# Patient Record
Sex: Male | Born: 1995 | Race: White | Hispanic: No | Marital: Single | Smoking: Current every day smoker
Health system: Southern US, Community
[De-identification: ages and names within clinical notes are randomized; demographics above are authoritative.]

## PROBLEM LIST (undated history)

## (undated) DIAGNOSIS — J189 Pneumonia, unspecified organism: Secondary | ICD-10-CM

## (undated) DIAGNOSIS — R011 Cardiac murmur, unspecified: Secondary | ICD-10-CM

## (undated) DIAGNOSIS — F419 Anxiety disorder, unspecified: Secondary | ICD-10-CM

---
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
## 2013-03-21 ENCOUNTER — Ambulatory Visit (INDEPENDENT_AMBULATORY_CARE_PROVIDER_SITE_OTHER): Payer: BC Managed Care – PPO | Admitting: Ophthalmology

## 2013-06-28 ENCOUNTER — Ambulatory Visit (INDEPENDENT_AMBULATORY_CARE_PROVIDER_SITE_OTHER): Payer: Self-pay | Admitting: Ophthalmology

## 2013-07-03 ENCOUNTER — Ambulatory Visit (INDEPENDENT_AMBULATORY_CARE_PROVIDER_SITE_OTHER): Payer: BC Managed Care – PPO | Admitting: Ophthalmology

## 2013-07-03 DIAGNOSIS — H33309 Unspecified retinal break, unspecified eye: Secondary | ICD-10-CM

## 2013-07-03 DIAGNOSIS — H33009 Unspecified retinal detachment with retinal break, unspecified eye: Secondary | ICD-10-CM

## 2013-07-03 DIAGNOSIS — H43819 Vitreous degeneration, unspecified eye: Secondary | ICD-10-CM

## 2013-07-03 DIAGNOSIS — H31009 Unspecified chorioretinal scars, unspecified eye: Secondary | ICD-10-CM

## 2013-07-03 NOTE — H&P (Signed)
Micheal Booker is an 18 y.o. male.   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  No past medical history on file.  No past surgical history on file.  No family history on file. Social History:  has no tobacco, alcohol, and drug history on file.  Allergies: Allergies not on file  No prescriptions prior to admission    Review of systems otherwise negative  There were no vitals taken for this visit.  Physical exam: Mental status: oriented x3. Eyes: See eye exam associated with this date of surgery in media tab.  Scanned in by scanning center Ears, Nose, Throat: within normal limits Neck: Within Normal limits General: within normal limits Chest: Within normal limits Breast: deferred Heart: Within normal limits Abdomen: Within normal limits GU: deferred Extremities: within normal limits Skin: within normal limits  Assessment/Plan Rhegmatogenous retinal detachment right eye Plan: To [facility] for Scleral buckle right eye  Hayden Pedro 07/03/2013, 3:28 PM

## 2013-07-06 ENCOUNTER — Encounter (HOSPITAL_COMMUNITY): Payer: Self-pay | Admitting: Pharmacy Technician

## 2013-07-09 ENCOUNTER — Encounter (HOSPITAL_COMMUNITY): Payer: Self-pay | Admitting: *Deleted

## 2013-07-10 ENCOUNTER — Encounter (HOSPITAL_COMMUNITY): Payer: BC Managed Care – PPO | Admitting: Anesthesiology

## 2013-07-10 ENCOUNTER — Ambulatory Visit (HOSPITAL_COMMUNITY): Payer: BC Managed Care – PPO | Admitting: Anesthesiology

## 2013-07-10 ENCOUNTER — Encounter (HOSPITAL_COMMUNITY): Payer: Self-pay | Admitting: *Deleted

## 2013-07-10 ENCOUNTER — Ambulatory Visit (HOSPITAL_COMMUNITY): Payer: BC Managed Care – PPO

## 2013-07-10 ENCOUNTER — Encounter (HOSPITAL_COMMUNITY)
Admission: RE | Disposition: A | Discharge status: Home / Self Care | Payer: Self-pay | Source: Ambulatory Visit | Attending: Ophthalmology

## 2013-07-10 ENCOUNTER — Observation Stay (HOSPITAL_COMMUNITY)
Admission: RE | Admit: 2013-07-10 | Discharge: 2013-07-11 | Disposition: A | Discharge status: Home / Self Care | Payer: BC Managed Care – PPO | Source: Ambulatory Visit | Attending: Ophthalmology | Admitting: Ophthalmology

## 2013-07-10 DIAGNOSIS — F411 Generalized anxiety disorder: Secondary | ICD-10-CM | POA: Diagnosis not present

## 2013-07-10 DIAGNOSIS — F17200 Nicotine dependence, unspecified, uncomplicated: Secondary | ICD-10-CM | POA: Insufficient documentation

## 2013-07-10 DIAGNOSIS — H33009 Unspecified retinal detachment with retinal break, unspecified eye: Principal | ICD-10-CM | POA: Insufficient documentation

## 2013-07-10 DIAGNOSIS — H33001 Unspecified retinal detachment with retinal break, right eye: Secondary | ICD-10-CM | POA: Diagnosis present

## 2013-07-10 HISTORY — DX: Cardiac murmur, unspecified: R01.1

## 2013-07-10 HISTORY — PX: SCLERAL BUCKLE: SHX5340

## 2013-07-10 HISTORY — DX: Anxiety disorder, unspecified: F41.9

## 2013-07-10 HISTORY — DX: Pneumonia, unspecified organism: J18.9

## 2013-07-10 HISTORY — PX: LASER PHOTO ABLATION: SHX5942

## 2013-07-10 LAB — CBC
HCT: 41.7 % (ref 39.0–52.0)
Hemoglobin: 14.7 g/dL (ref 13.0–17.0)
MCH: 30.9 pg (ref 26.0–34.0)
MCHC: 35.3 g/dL (ref 30.0–36.0)
MCV: 87.6 fL (ref 78.0–100.0)
Platelets: 283 K/uL (ref 150–400)
RBC: 4.76 MIL/uL (ref 4.22–5.81)
RDW: 12.4 % (ref 11.5–15.5)
WBC: 8 K/uL (ref 4.0–10.5)

## 2013-07-10 SURGERY — SCLERAL BUCKLE
Anesthesia: General | Site: Eye | Laterality: Right

## 2013-07-10 MED ORDER — SERTRALINE HCL 25 MG PO TABS
25.0000 mg | ORAL_TABLET | Freq: Every day | ORAL | Status: DC
  Filled 2013-07-10: qty 1

## 2013-07-10 MED ORDER — BUPIVACAINE HCL (PF) 0.75 % IJ SOLN
INTRAMUSCULAR | Status: AC
  Filled 2013-07-10: qty 10

## 2013-07-10 MED ORDER — FENTANYL CITRATE 0.05 MG/ML IJ SOLN
INTRAMUSCULAR | Status: AC
  Filled 2013-07-10: qty 5

## 2013-07-10 MED ORDER — BRIMONIDINE TARTRATE 0.2 % OP SOLN
1.0000 [drp] | Freq: Two times a day (BID) | OPHTHALMIC | Status: DC
  Filled 2013-07-10: qty 5

## 2013-07-10 MED ORDER — BUPIVACAINE HCL (PF) 0.75 % IJ SOLN
INTRAMUSCULAR | Status: DC | PRN
  Administered 2013-07-10: 10 mL

## 2013-07-10 MED ORDER — BSS IO SOLN
INTRAOCULAR | Status: DC | PRN
  Administered 2013-07-10: 15 mL via INTRAOCULAR

## 2013-07-10 MED ORDER — FENTANYL CITRATE 0.05 MG/ML IJ SOLN
INTRAMUSCULAR | Status: DC | PRN
  Administered 2013-07-10: 25 ug via INTRAVENOUS
  Administered 2013-07-10 (×2): 50 ug via INTRAVENOUS
  Administered 2013-07-10: 25 ug via INTRAVENOUS

## 2013-07-10 MED ORDER — DEXAMETHASONE SODIUM PHOSPHATE 10 MG/ML IJ SOLN
INTRAMUSCULAR | Status: AC
  Filled 2013-07-10: qty 1

## 2013-07-10 MED ORDER — CEFAZOLIN SODIUM-DEXTROSE 2-3 GM-% IV SOLR
INTRAVENOUS | Status: DC | PRN
  Administered 2013-07-10: 2 g via INTRAVENOUS

## 2013-07-10 MED ORDER — HYDROMORPHONE HCL PF 1 MG/ML IJ SOLN
INTRAMUSCULAR | Status: AC
  Filled 2013-07-10: qty 1

## 2013-07-10 MED ORDER — LACTATED RINGERS IV SOLN
INTRAVENOUS | Status: DC | PRN
  Administered 2013-07-10 (×2): via INTRAVENOUS

## 2013-07-10 MED ORDER — LATANOPROST 0.005 % OP SOLN
1.0000 [drp] | Freq: Every day | OPHTHALMIC | Status: DC
  Filled 2013-07-10: qty 2.5

## 2013-07-10 MED ORDER — OXYCODONE HCL 5 MG PO TABS: 5.0000 mg | ORAL_TABLET | Freq: Once | ORAL | Status: DC | PRN

## 2013-07-10 MED ORDER — LIDOCAINE HCL 2 % IJ SOLN
INTRAMUSCULAR | Status: AC
  Filled 2013-07-10: qty 20

## 2013-07-10 MED ORDER — HYDROCODONE-ACETAMINOPHEN 5-325 MG PO TABS
1.0000 | ORAL_TABLET | ORAL | Status: DC | PRN
  Administered 2013-07-10: 2 via ORAL
  Filled 2013-07-10: qty 2

## 2013-07-10 MED ORDER — CYCLOPENTOLATE HCL 1 % OP SOLN
1.0000 [drp] | OPHTHALMIC | Status: AC | PRN
  Administered 2013-07-10: 1 [drp] via OPHTHALMIC
  Filled 2013-07-10: qty 2

## 2013-07-10 MED ORDER — DOCUSATE SODIUM 100 MG PO CAPS
100.0000 mg | ORAL_CAPSULE | Freq: Two times a day (BID) | ORAL | Status: DC
  Administered 2013-07-10: 100 mg via ORAL
  Filled 2013-07-10: qty 1

## 2013-07-10 MED ORDER — DEXAMETHASONE SODIUM PHOSPHATE 10 MG/ML IJ SOLN
INTRAMUSCULAR | Status: DC | PRN
  Administered 2013-07-10: 10 mg

## 2013-07-10 MED ORDER — MINERAL OIL LIGHT 100 % EX OIL
TOPICAL_OIL | CUTANEOUS | Status: AC
  Filled 2013-07-10: qty 25

## 2013-07-10 MED ORDER — POLYMYXIN B SULFATE 500000 UNITS IJ SOLR
INTRAMUSCULAR | Status: AC
  Filled 2013-07-10: qty 1

## 2013-07-10 MED ORDER — ROCURONIUM BROMIDE 50 MG/5ML IV SOLN
INTRAVENOUS | Status: AC
  Filled 2013-07-10: qty 1

## 2013-07-10 MED ORDER — BACITRACIN-POLYMYXIN B 500-10000 UNIT/GM OP OINT
TOPICAL_OINTMENT | OPHTHALMIC | Status: DC | PRN
  Administered 2013-07-10: 1 via OPHTHALMIC

## 2013-07-10 MED ORDER — TETRACAINE HCL 0.5 % OP SOLN
2.0000 [drp] | Freq: Once | OPHTHALMIC | Status: DC
  Filled 2013-07-10: qty 2

## 2013-07-10 MED ORDER — GATIFLOXACIN 0.5 % OP SOLN
1.0000 [drp] | Freq: Four times a day (QID) | OPHTHALMIC | Status: DC
  Filled 2013-07-10: qty 2.5

## 2013-07-10 MED ORDER — MAGNESIUM HYDROXIDE 400 MG/5ML PO SUSP: 15.0000 mL | Freq: Four times a day (QID) | ORAL | Status: DC | PRN

## 2013-07-10 MED ORDER — TEMAZEPAM 15 MG PO CAPS: 15.0000 mg | ORAL_CAPSULE | Freq: Every evening | ORAL | Status: DC | PRN

## 2013-07-10 MED ORDER — PROPOFOL 10 MG/ML IV BOLUS
INTRAVENOUS | Status: DC | PRN
  Administered 2013-07-10: 150 mg via INTRAVENOUS

## 2013-07-10 MED ORDER — GATIFLOXACIN 0.5 % OP SOLN
1.0000 [drp] | OPHTHALMIC | Status: DC | PRN
  Administered 2013-07-10 (×2): 1 [drp] via OPHTHALMIC

## 2013-07-10 MED ORDER — ONDANSETRON HCL 4 MG/2ML IJ SOLN: 4.0000 mg | Freq: Four times a day (QID) | INTRAMUSCULAR | Status: DC | PRN

## 2013-07-10 MED ORDER — BSS IO SOLN
INTRAOCULAR | Status: AC
  Filled 2013-07-10: qty 15

## 2013-07-10 MED ORDER — PHENYLEPHRINE HCL 2.5 % OP SOLN
1.0000 [drp] | OPHTHALMIC | Status: AC | PRN
  Administered 2013-07-10: 1 [drp] via OPHTHALMIC
  Filled 2013-07-10: qty 15

## 2013-07-10 MED ORDER — TROPICAMIDE 1 % OP SOLN
1.0000 [drp] | OPHTHALMIC | Status: AC | PRN
  Administered 2013-07-10: 1 [drp] via OPHTHALMIC
  Filled 2013-07-10: qty 3

## 2013-07-10 MED ORDER — ACETAZOLAMIDE SODIUM 500 MG IJ SOLR
INTRAMUSCULAR | Status: AC
  Filled 2013-07-10: qty 500

## 2013-07-10 MED ORDER — PREDNISOLONE ACETATE 1 % OP SUSP
1.0000 [drp] | Freq: Four times a day (QID) | OPHTHALMIC | Status: DC
  Filled 2013-07-10: qty 1

## 2013-07-10 MED ORDER — BACITRACIN-POLYMYXIN B 500-10000 UNIT/GM OP OINT
TOPICAL_OINTMENT | OPHTHALMIC | Status: AC
  Filled 2013-07-10: qty 3.5

## 2013-07-10 MED ORDER — OXYCODONE HCL 5 MG/5ML PO SOLN: 5.0000 mg | Freq: Once | ORAL | Status: DC | PRN

## 2013-07-10 MED ORDER — GLYCOPYRROLATE 0.2 MG/ML IJ SOLN
INTRAMUSCULAR | Status: AC
Start: 2013-07-10 — End: 2013-07-10
  Filled 2013-07-10: qty 2

## 2013-07-10 MED ORDER — 0.9 % SODIUM CHLORIDE (POUR BTL) OPTIME
TOPICAL | Status: DC | PRN
  Administered 2013-07-10: 1000 mL

## 2013-07-10 MED ORDER — GLYCOPYRROLATE 0.2 MG/ML IJ SOLN
INTRAMUSCULAR | Status: DC | PRN
  Administered 2013-07-10: 0.2 mg via INTRAVENOUS
  Administered 2013-07-10: 0.4 mg via INTRAVENOUS

## 2013-07-10 MED ORDER — SODIUM CHLORIDE 0.9 % IV SOLN
INTRAVENOUS | Status: DC
  Administered 2013-07-10: 11:00:00 via INTRAVENOUS

## 2013-07-10 MED ORDER — HYDROMORPHONE HCL PF 1 MG/ML IJ SOLN
0.2500 mg | INTRAMUSCULAR | Status: DC | PRN
  Administered 2013-07-10: 0.5 mg via INTRAVENOUS

## 2013-07-10 MED ORDER — HYPROMELLOSE (GONIOSCOPIC) 2.5 % OP SOLN
OPHTHALMIC | Status: AC
  Filled 2013-07-10: qty 15

## 2013-07-10 MED ORDER — MIDAZOLAM HCL 5 MG/5ML IJ SOLN
INTRAMUSCULAR | Status: DC | PRN
  Administered 2013-07-10 (×2): 1 mg via INTRAVENOUS

## 2013-07-10 MED ORDER — BSS PLUS IO SOLN
INTRAOCULAR | Status: AC
  Filled 2013-07-10: qty 500

## 2013-07-10 MED ORDER — ROCURONIUM BROMIDE 100 MG/10ML IV SOLN
INTRAVENOUS | Status: DC | PRN
  Administered 2013-07-10: 50 mg via INTRAVENOUS

## 2013-07-10 MED ORDER — NEOSTIGMINE METHYLSULFATE 1 MG/ML IJ SOLN
INTRAMUSCULAR | Status: DC | PRN
Start: 2013-07-10 — End: 2013-07-10
  Administered 2013-07-10: 3 mg via INTRAVENOUS

## 2013-07-10 MED ORDER — ARTIFICIAL TEARS OP OINT
TOPICAL_OINTMENT | OPHTHALMIC | Status: AC
  Filled 2013-07-10: qty 3.5

## 2013-07-10 MED ORDER — IMIPRAMINE HCL 25 MG PO TABS
25.0000 mg | ORAL_TABLET | Freq: Every day | ORAL | Status: DC
  Administered 2013-07-10: 25 mg via ORAL
  Filled 2013-07-10 (×2): qty 1

## 2013-07-10 MED ORDER — GATIFLOXACIN 0.5 % OP SOLN
1.0000 [drp] | OPHTHALMIC | Status: AC | PRN
  Administered 2013-07-10: 1 [drp] via OPHTHALMIC
  Filled 2013-07-10: qty 2.5

## 2013-07-10 MED ORDER — PHENYLEPHRINE HCL 2.5 % OP SOLN
1.0000 [drp] | OPHTHALMIC | Status: DC | PRN
  Administered 2013-07-10 (×2): 1 [drp] via OPHTHALMIC

## 2013-07-10 MED ORDER — SODIUM CHLORIDE 0.9 % IJ SOLN
INTRAMUSCULAR | Status: DC | PRN
  Administered 2013-07-10: 14:00:00

## 2013-07-10 MED ORDER — MIDAZOLAM HCL 2 MG/2ML IJ SOLN
INTRAMUSCULAR | Status: AC
  Filled 2013-07-10: qty 2

## 2013-07-10 MED ORDER — TRIAMCINOLONE ACETONIDE 40 MG/ML IJ SUSP
INTRAMUSCULAR | Status: AC
  Filled 2013-07-10: qty 5

## 2013-07-10 MED ORDER — TROPICAMIDE 1 % OP SOLN
1.0000 [drp] | OPHTHALMIC | Status: DC | PRN
  Administered 2013-07-10 (×2): 1 [drp] via OPHTHALMIC

## 2013-07-10 MED ORDER — PROMETHAZINE HCL 25 MG/ML IJ SOLN
6.2500 mg | INTRAMUSCULAR | Status: DC | PRN
  Administered 2013-07-10: 6.25 mg via INTRAVENOUS

## 2013-07-10 MED ORDER — ONDANSETRON HCL 4 MG/2ML IJ SOLN
INTRAMUSCULAR | Status: DC | PRN
  Administered 2013-07-10: 4 mg via INTRAVENOUS

## 2013-07-10 MED ORDER — ACETAMINOPHEN 325 MG PO TABS: 325.0000 mg | ORAL_TABLET | ORAL | Status: DC | PRN

## 2013-07-10 MED ORDER — CYCLOPENTOLATE HCL 1 % OP SOLN
1.0000 [drp] | OPHTHALMIC | Status: DC | PRN
  Administered 2013-07-10 (×2): 1 [drp] via OPHTHALMIC

## 2013-07-10 MED ORDER — NEOSTIGMINE METHYLSULFATE 1 MG/ML IJ SOLN
INTRAMUSCULAR | Status: AC
  Filled 2013-07-10: qty 10

## 2013-07-10 MED ORDER — ONDANSETRON HCL 4 MG/2ML IJ SOLN
INTRAMUSCULAR | Status: AC
  Filled 2013-07-10: qty 2

## 2013-07-10 MED ORDER — ACETAZOLAMIDE SODIUM 500 MG IJ SOLR
500.0000 mg | Freq: Once | INTRAMUSCULAR | Status: AC
  Administered 2013-07-11: 500 mg via INTRAVENOUS
  Filled 2013-07-10: qty 500

## 2013-07-10 MED ORDER — ATROPINE SULFATE 1 % OP SOLN
OPHTHALMIC | Status: AC
  Filled 2013-07-10: qty 2

## 2013-07-10 MED ORDER — GLYCOPYRROLATE 0.2 MG/ML IJ SOLN
INTRAMUSCULAR | Status: AC
  Filled 2013-07-10: qty 3

## 2013-07-10 MED ORDER — BACITRACIN-POLYMYXIN B 500-10000 UNIT/GM OP OINT
1.0000 "application " | TOPICAL_OINTMENT | Freq: Four times a day (QID) | OPHTHALMIC | Status: DC
  Filled 2013-07-10: qty 3.5

## 2013-07-10 MED ORDER — LIDOCAINE HCL (CARDIAC) 20 MG/ML IV SOLN
INTRAVENOUS | Status: DC | PRN
  Administered 2013-07-10: 40 mg via INTRAVENOUS

## 2013-07-10 MED ORDER — ATROPINE SULFATE 1 % OP SOLN
OPHTHALMIC | Status: DC | PRN
  Administered 2013-07-10: 1 [drp] via OPHTHALMIC

## 2013-07-10 MED ORDER — EPINEPHRINE HCL 1 MG/ML IJ SOLN
INTRAMUSCULAR | Status: AC
  Filled 2013-07-10: qty 1

## 2013-07-10 MED ORDER — GENTAMICIN SULFATE 40 MG/ML IJ SOLN
INTRAMUSCULAR | Status: AC
  Filled 2013-07-10: qty 2

## 2013-07-10 MED ORDER — PROPOFOL 10 MG/ML IV BOLUS
INTRAVENOUS | Status: AC
  Filled 2013-07-10: qty 20

## 2013-07-10 MED ORDER — SODIUM CHLORIDE 0.45 % IV SOLN: INTRAVENOUS | Status: DC

## 2013-07-10 MED ORDER — SODIUM HYALURONATE 10 MG/ML IO SOLN
INTRAOCULAR | Status: AC
  Filled 2013-07-10: qty 0.85

## 2013-07-10 MED ORDER — MORPHINE SULFATE 2 MG/ML IJ SOLN: 1.0000 mg | INTRAMUSCULAR | Status: DC | PRN

## 2013-07-10 SURGICAL SUPPLY — 54 items
APPLICATOR DR MATTHEWS STRL (MISCELLANEOUS) ×18 IMPLANT
BLADE EYE CATARACT 19 1.4 BEAV (BLADE) ×3 IMPLANT
COTTONBALL LRG STERILE PKG (GAUZE/BANDAGES/DRESSINGS) ×9 IMPLANT
COVER SURGICAL LIGHT HANDLE (MISCELLANEOUS) ×3 IMPLANT
DRAPE INCISE 51X51 W/FILM STRL (DRAPES) ×3 IMPLANT
DRAPE OPHTHALMIC 77X100 STRL (CUSTOM PROCEDURE TRAY) ×3 IMPLANT
ERASER HMR WETFIELD 23G BP (MISCELLANEOUS) IMPLANT
FILTER STRAW FLUID ASPIR (MISCELLANEOUS) IMPLANT
GLOVE SS BIOGEL STRL SZ 6.5 (GLOVE) ×1 IMPLANT
GLOVE SS BIOGEL STRL SZ 7 (GLOVE) ×1 IMPLANT
GLOVE SUPERSENSE BIOGEL SZ 6.5 (GLOVE) ×2
GLOVE SUPERSENSE BIOGEL SZ 7 (GLOVE) ×2
GLOVE SURG 8.5 LATEX PF (GLOVE) ×3 IMPLANT
GOWN STRL REUS W/ TWL LRG LVL3 (GOWN DISPOSABLE) ×2 IMPLANT
GOWN STRL REUS W/TWL LRG LVL3 (GOWN DISPOSABLE) ×4
IMPL SILICONE (Ophthalmic Related) ×1 IMPLANT
IMPLANT SILICONE (Ophthalmic Related) ×5 IMPLANT
KIT BASIN OR (CUSTOM PROCEDURE TRAY) ×3 IMPLANT
KIT PERFLUORON PROCEDURE 5ML (MISCELLANEOUS) IMPLANT
KIT ROOM TURNOVER OR (KITS) ×3 IMPLANT
KNIFE GRIESHABER SHARP 2.5MM (MISCELLANEOUS) ×9 IMPLANT
MASK EYE SHIELD (GAUZE/BANDAGES/DRESSINGS) ×3 IMPLANT
NEEDLE 18GX1X1/2 (RX/OR ONLY) (NEEDLE) ×3 IMPLANT
NEEDLE HYPO 30X.5 LL (NEEDLE) ×6 IMPLANT
NS IRRIG 1000ML POUR BTL (IV SOLUTION) ×3 IMPLANT
PACK VITRECTOMY CUSTOM (CUSTOM PROCEDURE TRAY) ×3 IMPLANT
PAD ARMBOARD 7.5X6 YLW CONV (MISCELLANEOUS) ×6 IMPLANT
PAD EYE OVAL STERILE LF (GAUZE/BANDAGES/DRESSINGS) ×3 IMPLANT
REPL STRA BRUSH NEEDLE (NEEDLE) IMPLANT
RESERVOIR BACK FLUSH (MISCELLANEOUS) IMPLANT
ROLLS DENTAL (MISCELLANEOUS) ×6 IMPLANT
SLEEVE SCLERAL TYPE 270 (Ophthalmic Related) ×3 IMPLANT
SPEAR EYE SURG WECK-CEL (MISCELLANEOUS) ×12 IMPLANT
SPONGE SURGIFOAM ABS GEL 12-7 (HEMOSTASIS) IMPLANT
STOPCOCK 4 WAY LG BORE MALE ST (IV SETS) IMPLANT
SUT CHROMIC 7 0 TG140 8 (SUTURE) ×6 IMPLANT
SUT ETHILON 9 0 TG140 8 (SUTURE) IMPLANT
SUT MERSILENE 4 0 RV 2 (SUTURE) ×6 IMPLANT
SUT SILK 2 0 (SUTURE) ×2
SUT SILK 2-0 18XBRD TIE 12 (SUTURE) ×1 IMPLANT
SUT SILK 4 0 RB 1 (SUTURE) ×3 IMPLANT
SYR 20CC LL (SYRINGE) ×3 IMPLANT
SYR 50ML LL SCALE MARK (SYRINGE) IMPLANT
SYR 5ML LL (SYRINGE) ×3 IMPLANT
SYR BULB 3OZ (MISCELLANEOUS) ×3 IMPLANT
SYR TB 1ML LUER SLIP (SYRINGE) IMPLANT
SYRINGE 10CC LL (SYRINGE) ×3 IMPLANT
TAPE SURG TRANSPORE 1 IN (GAUZE/BANDAGES/DRESSINGS) ×1 IMPLANT
TAPE SURGICAL TRANSPORE 1 IN (GAUZE/BANDAGES/DRESSINGS) ×2
TIRE RTNL 2.5XGRV CNCV 9X (Ophthalmic Related) ×1 IMPLANT
TOWEL OR 17X24 6PK STRL BLUE (TOWEL DISPOSABLE) ×9 IMPLANT
TUBING ART PRESS 12 MALE/MALE (MISCELLANEOUS) IMPLANT
WATER STERILE IRR 1000ML POUR (IV SOLUTION) ×3 IMPLANT
WIPE INSTRUMENT VISIWIPE 73X73 (MISCELLANEOUS) ×3 IMPLANT

## 2013-07-10 NOTE — Preoperative (Signed)
Beta Blockers   Reason not to administer Beta Blockers:Not Applicable 

## 2013-07-10 NOTE — Anesthesia Preprocedure Evaluation (Addendum)
Anesthesia Evaluation  Patient identified by MRN, date of birth, ID band Patient awake    Reviewed: Allergy & Precautions, H&P , NPO status , Patient's Chart, lab work & pertinent test results  Airway Mallampati: II  Neck ROM: Full  Mouth opening: Limited Mouth Opening  Dental  (+) Teeth Intact   Pulmonary Current Smoker,  breath sounds clear to auscultation        Cardiovascular Rhythm:Regular Rate:Normal     Neuro/Psych Anxiety    GI/Hepatic   Endo/Other  Morbid obesity  Renal/GU      Musculoskeletal   Abdominal (+) + obese,   Peds  Hematology   Anesthesia Other Findings   Reproductive/Obstetrics                         Anesthesia Physical Anesthesia Plan  ASA: II  Anesthesia Plan: General   Post-op Pain Management:    Induction: Intravenous  Airway Management Planned: Oral ETT  Additional Equipment:   Intra-op Plan:   Post-operative Plan: Extubation in OR  Informed Consent:   Plan Discussed with:   Anesthesia Plan Comments:         Anesthesia Quick Evaluation

## 2013-07-10 NOTE — Brief Op Note (Signed)
Brief Operative note   Preoperative diagnosis:  retinal detachment right eye Postoperative diagnosis  Post-Op Diagnosis Codes:    * Retinal detachment with retinal defect, unspecified [361.00]  Procedures: Scleral buckle right eye.  Laser photocoagulation right eye  Surgeon:  Hayden Pedro, MD...  Assistant:  Deatra Ina SA    Anesthesia: General  Specimen: none  Estimated blood loss:  1cc  Complications: none  Patient sent to PACU in good condition  Composed by Hayden Pedro MD  Dictation number: [id number]

## 2013-07-10 NOTE — Anesthesia Postprocedure Evaluation (Signed)
  Anesthesia Post-op Note  Patient: Micheal Booker  Procedure(s) Performed: Procedure(s) with comments: SCLERAL BUCKLE (Right) LASER PHOTO ABLATION (Right) - Headscope laser - 23 marks  Patient Location: PACU  Anesthesia Type:General  Level of Consciousness: awake and alert   Airway and Oxygen Therapy: Patient Spontanous Breathing  Post-op Pain: mild  Post-op Assessment: Post-op Vital signs reviewed, Patient's Cardiovascular Status Stable and Respiratory Function Stable  Post-op Vital Signs: Reviewed  Filed Vitals:   07/10/13 1530  BP: 125/53  Pulse: 90  Temp: 36.7 C  Resp: 17    Complications: No apparent anesthesia complications

## 2013-07-10 NOTE — H&P (Signed)
I examined the patient today and there is no change in the medical status 

## 2013-07-10 NOTE — Transfer of Care (Signed)
Immediate Anesthesia Transfer of Care Note  Patient: Micheal Booker  Procedure(s) Performed: Procedure(s) with comments: SCLERAL BUCKLE (Right) LASER PHOTO ABLATION (Right) - Headscope laser - 59 marks  Patient Location: PACU  Anesthesia Type:General  Level of Consciousness: awake, alert  and oriented  Airway & Oxygen Therapy: Patient Spontanous Breathing and Patient connected to nasal cannula oxygen  Post-op Assessment: Report given to PACU RN  Post vital signs: Reviewed and stable  Complications: No apparent anesthesia complications

## 2013-07-11 DIAGNOSIS — H33009 Unspecified retinal detachment with retinal break, unspecified eye: Secondary | ICD-10-CM | POA: Diagnosis not present

## 2013-07-11 MED ORDER — HYDROCODONE-ACETAMINOPHEN 5-325 MG PO TABS: 1.0000 | ORAL_TABLET | ORAL | Status: AC | PRN

## 2013-07-11 MED ORDER — GATIFLOXACIN 0.5 % OP SOLN: 1.0000 [drp] | Freq: Four times a day (QID) | OPHTHALMIC | Status: AC

## 2013-07-11 MED ORDER — BACITRACIN-POLYMYXIN B 500-10000 UNIT/GM OP OINT: 1.0000 "application " | TOPICAL_OINTMENT | Freq: Four times a day (QID) | OPHTHALMIC | Status: AC

## 2013-07-11 MED ORDER — PREDNISOLONE ACETATE 1 % OP SUSP: 1.0000 [drp] | Freq: Four times a day (QID) | OPHTHALMIC | Status: AC

## 2013-07-11 NOTE — Progress Notes (Signed)
07/11/2013, 6:27 AM  Mental Status:  Awake, Alert, Oriented  Anterior segment: Cornea  Clear    Anterior Chamber Clear    Lens:   Clear  Intra Ocular Pressure 10 mmHg with Tonopen  Vitreous: Clear   Retina:  Attached Good laser reaction   Impression: Excellent result Retina attached   Final Diagnosis: Principal Problem:   Rhegmatogenous retinal detachment of right eye   Plan: start post operative eye drops.  Discharge to home.  Give post operative instructions  Micheal Booker 07/11/2013, 6:27 AM

## 2013-07-11 NOTE — Progress Notes (Signed)
Patient discharged home with parents.Patient and parents verbalized understanding of discharge instructions.

## 2013-07-11 NOTE — Op Note (Signed)
NAMELORCAN, SHELP                 ACCOUNT NO.:  [id number]  MEDICAL RECORD NO.:  [id number]  LOCATION:  [id number]                        FACILITY:  [facility]  PHYSICIAN:  Chrystie Nose. Zigmund Daniel, M.D. DATE OF BIRTH:  Aug 07, 1995  DATE OF PROCEDURE:  07/10/2013 DATE OF DISCHARGE:                              OPERATIVE REPORT   ADMISSION DIAGNOSIS:  Rhegmatogenous retinal detachment, right eye.  PROCEDURE:  Scleral buckle, right eye, retinal photocoagulation right eye.  SURGEON:  Chrystie Nose. Zigmund Daniel, M.D.  ASSISTANT:  Deatra Ina SA.  ANESTHESIA:  General.  DETAILS:  Usual prep and drape, 360 degrees limbal peritomy isolation of 4 rectus muscles on 2-0 silk.  Scleral dissection for 360 degrees admitted to 279 implant with 3-mm trim from the posterior edge from 3 o'clock and 11 o'clock.  Diathermy placed in the bed.  Two sutures per quadrant for total of 8 scleral sutures were placed.  The scleral buckle was placed in the scleral bed.  Diathermy was placed in the bed as well. The perforation site chosen at 2 o'clock revealed a small amount of clear colorless subretinal fluid.  The buckle was trimmed and the scleral sutures were drawn securely.  Paracentesis was performed to allow for better indentation of the buckle.  The buckle was adjusted and trimmed and the band was adjusted and trimmed.  The scleral sutures were drawn securely and knotted and the free ends removed.  Indirect ophthalmoscopy showed the retina to be lying nicely and placed on the scleral buckle.  The retina was attached, the indirect ophthalmoscope laser was moved into place.  An 858 burns was placed around the retinal periphery.  The power of 460 mW 1000 microns each and 0.1 seconds each. The conjunctiva was reposited with 7-0 chromic suture.  Polymyxin and gentamicin were irrigated into tenon space.  Atropine solution was applied.  Marcaine was injected around the globe for postop pain. Decadron 10 mg in 1 mL was injected  into the lower subconjunctival space.  Closing pressure was 10 with a Barraquer tonometer after paracentesis x2.  Polysporin ophthalmic ointment, a patch and shield were placed.  The patient was awakened, and taken to recovery in satisfactory condition.  COMPLICATIONS:  None.  DURATION:  2 hours.    Chrystie Nose. Zigmund Daniel, M.D.    JDM/MEDQ  D:  07/10/2013  T:  07/11/2013  Job:  [id number]

## 2013-07-11 NOTE — Discharge Summary (Signed)
Discharge summary not needed on [facility] patients per medical records. 

## 2013-07-12 ENCOUNTER — Encounter (HOSPITAL_COMMUNITY): Payer: Self-pay | Admitting: Ophthalmology

## 2013-07-17 ENCOUNTER — Inpatient Hospital Stay (INDEPENDENT_AMBULATORY_CARE_PROVIDER_SITE_OTHER): Payer: BC Managed Care – PPO | Admitting: Ophthalmology

## 2013-07-17 DIAGNOSIS — H33009 Unspecified retinal detachment with retinal break, unspecified eye: Secondary | ICD-10-CM

## 2013-07-25 ENCOUNTER — Ambulatory Visit (INDEPENDENT_AMBULATORY_CARE_PROVIDER_SITE_OTHER): Payer: BC Managed Care – PPO | Admitting: Ophthalmology

## 2013-07-25 DIAGNOSIS — H33009 Unspecified retinal detachment with retinal break, unspecified eye: Secondary | ICD-10-CM

## 2013-08-27 ENCOUNTER — Encounter (INDEPENDENT_AMBULATORY_CARE_PROVIDER_SITE_OTHER): Payer: BC Managed Care – PPO | Admitting: Ophthalmology

## 2013-08-27 DIAGNOSIS — H33009 Unspecified retinal detachment with retinal break, unspecified eye: Secondary | ICD-10-CM

## 2013-11-05 ENCOUNTER — Encounter (INDEPENDENT_AMBULATORY_CARE_PROVIDER_SITE_OTHER): Payer: BC Managed Care – PPO | Admitting: Ophthalmology

## 2013-11-05 DIAGNOSIS — H43819 Vitreous degeneration, unspecified eye: Secondary | ICD-10-CM

## 2013-11-05 DIAGNOSIS — H33009 Unspecified retinal detachment with retinal break, unspecified eye: Secondary | ICD-10-CM

## 2013-11-05 DIAGNOSIS — H2510 Age-related nuclear cataract, unspecified eye: Secondary | ICD-10-CM

## 2014-09-21 IMAGING — CR DG CHEST 2V
2 series · 2 of 2 positions shown · non-contrast
Comparison: None.

CLINICAL DATA: Preoperative retina surgery

EXAM:
CHEST  2 VIEW

[w chest pa]
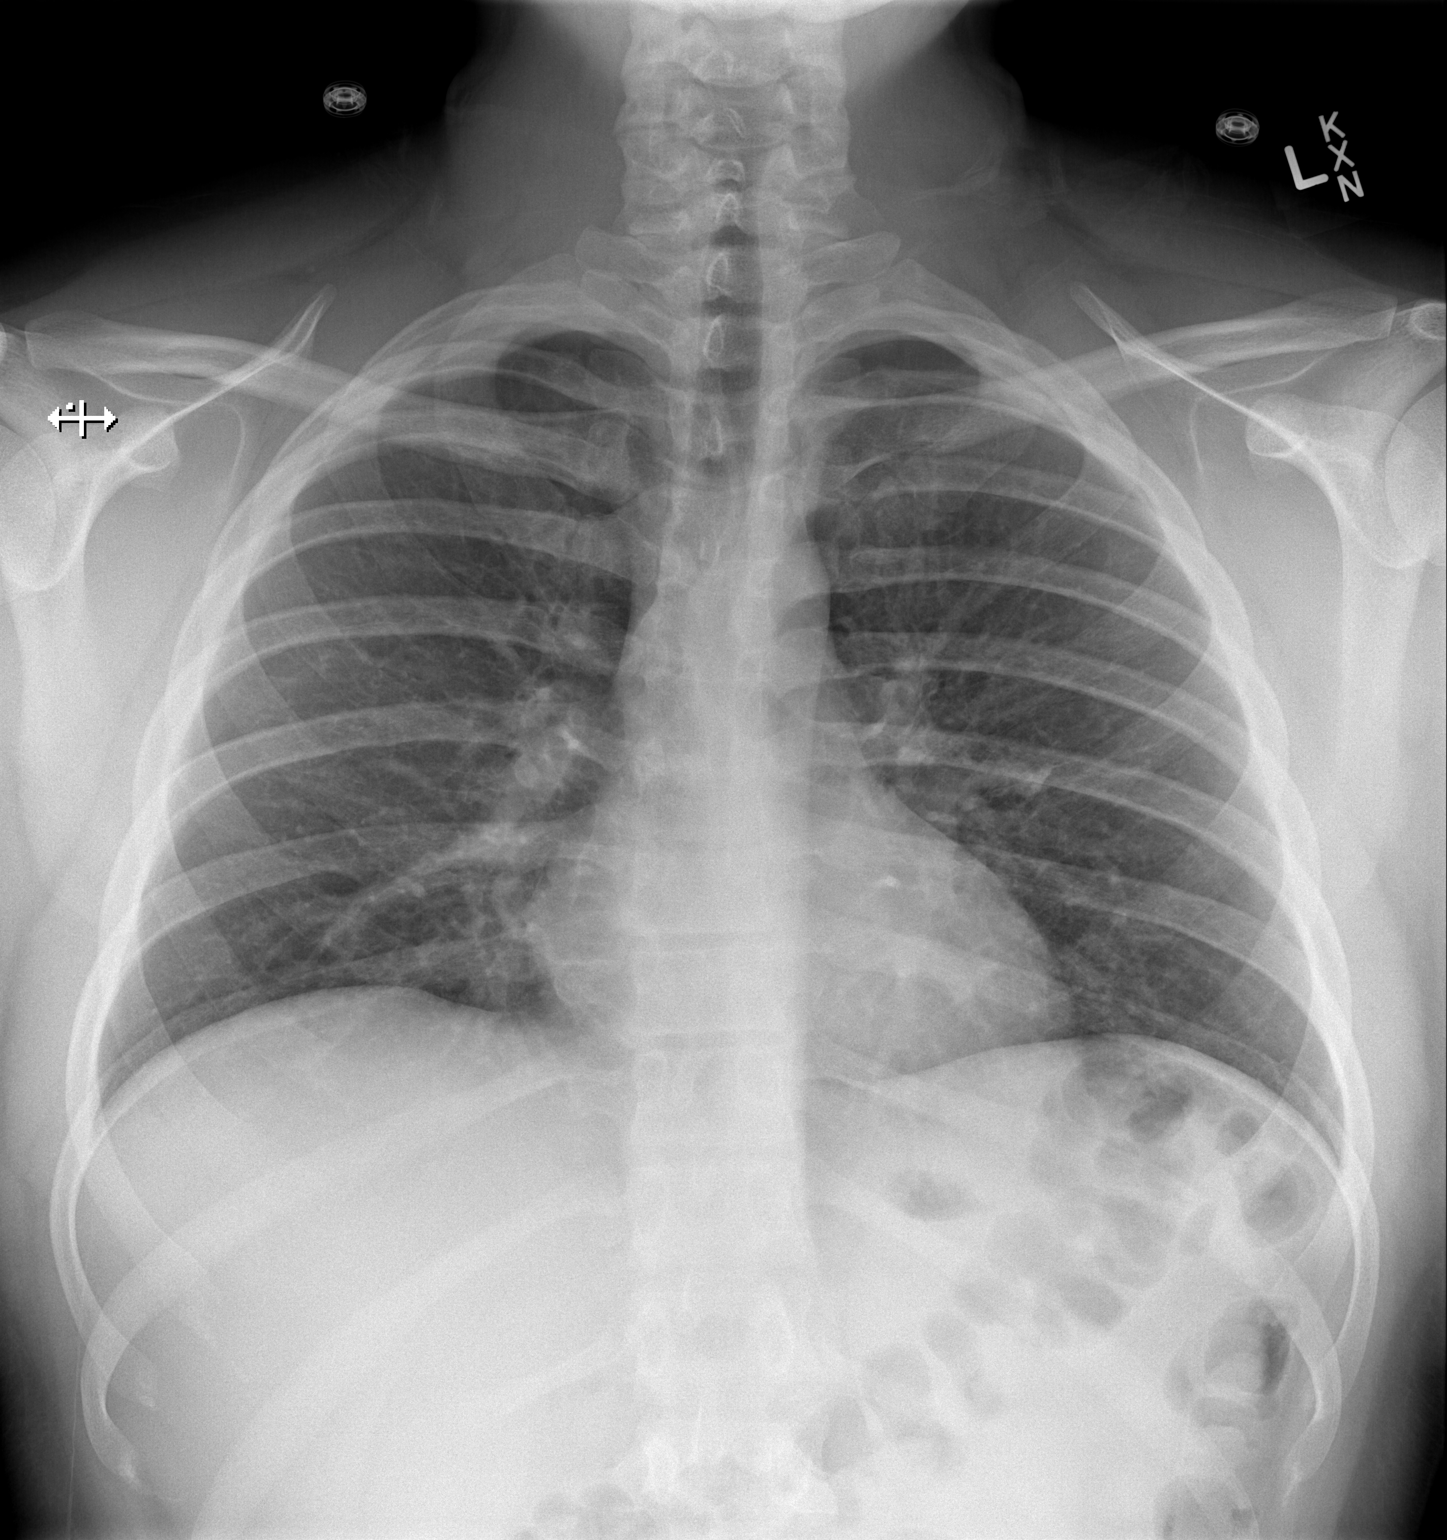

[w chest lat]
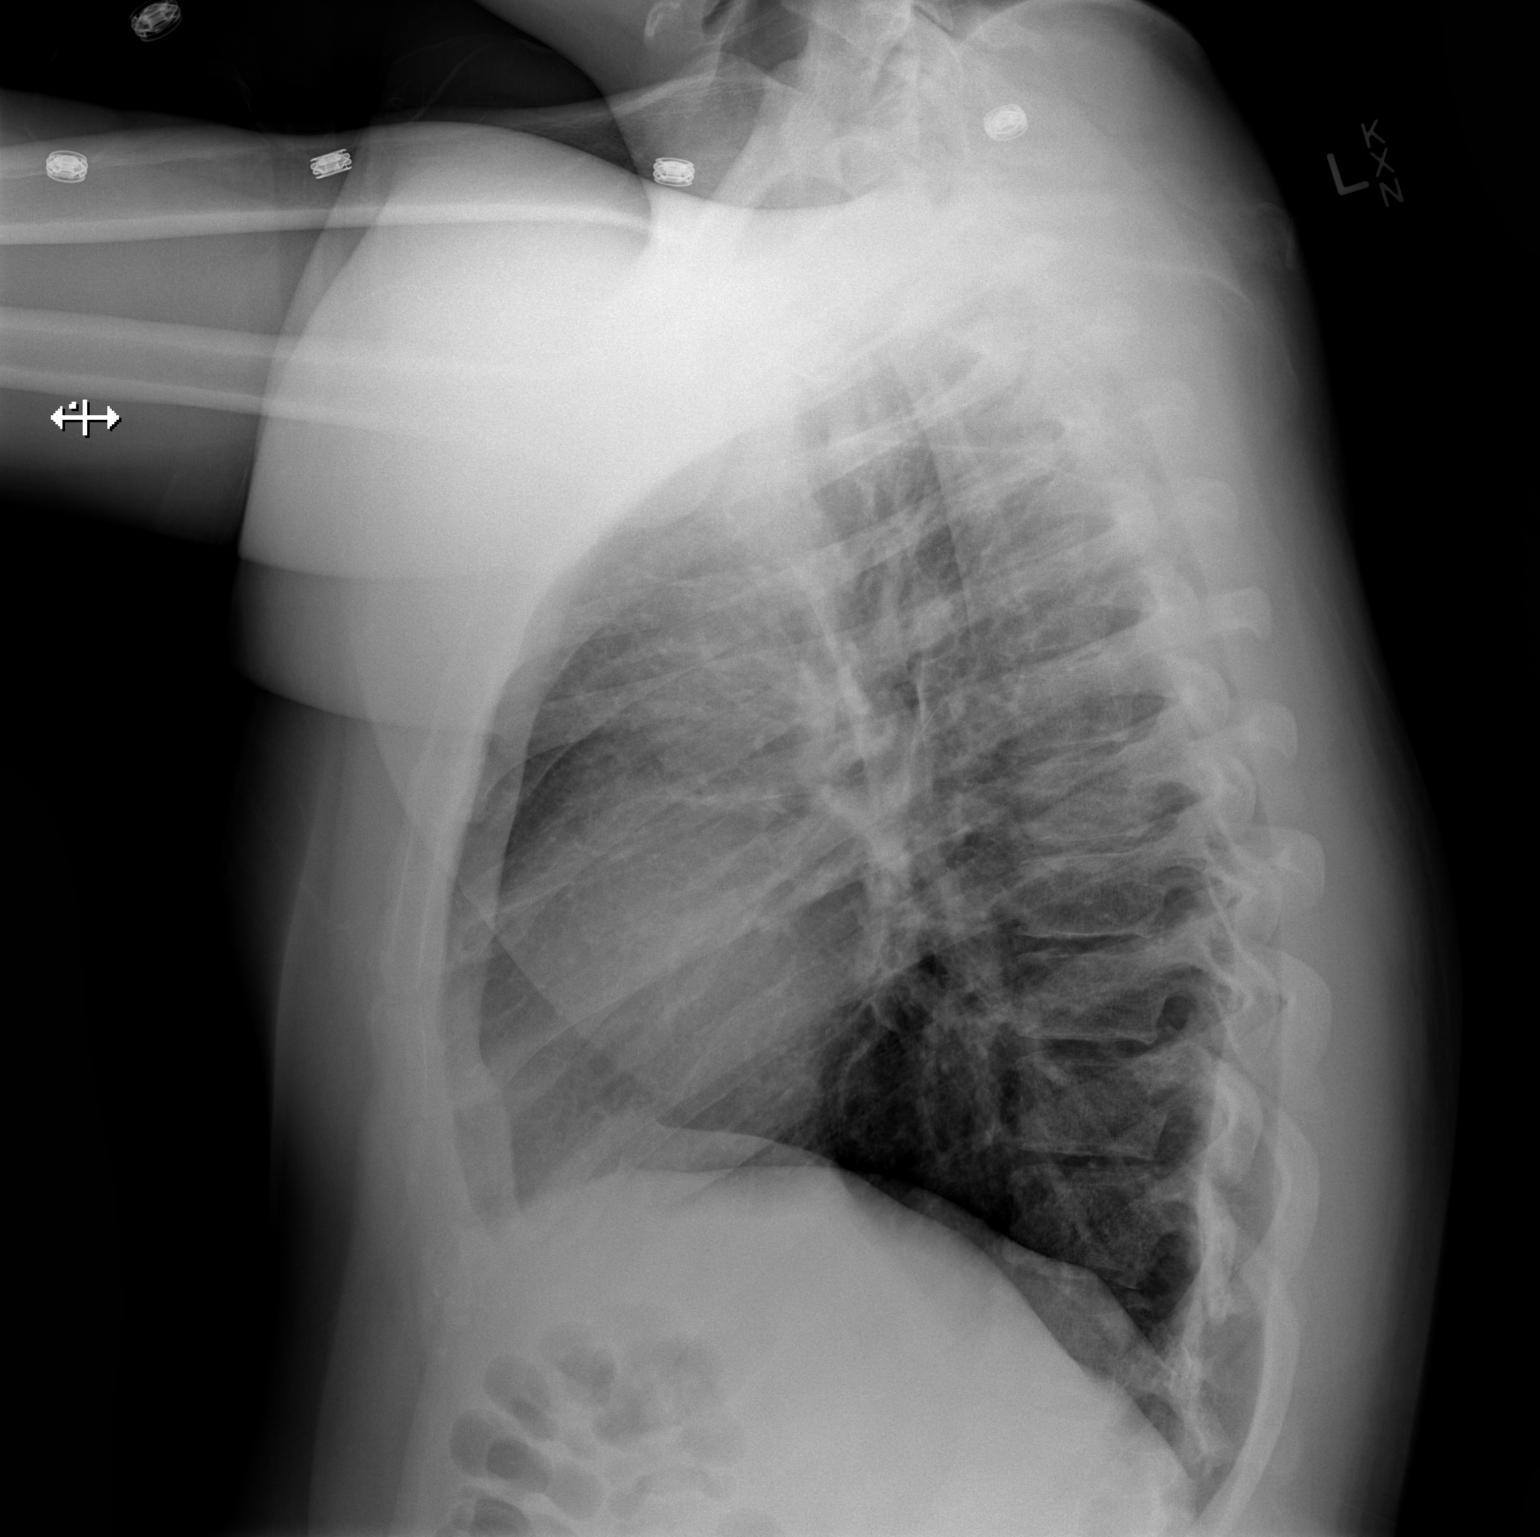

[2 of 2 positions shown; findings below may reference images not displayed]

FINDINGS: Lungs are clear. Heart size and pulmonary vascularity are normal. No
adenopathy. No bone lesions.
IMPRESSION: No abnormality noted.

## 2014-11-18 ENCOUNTER — Ambulatory Visit (INDEPENDENT_AMBULATORY_CARE_PROVIDER_SITE_OTHER): Payer: BC Managed Care – PPO | Admitting: Ophthalmology

## 2014-11-21 ENCOUNTER — Ambulatory Visit (INDEPENDENT_AMBULATORY_CARE_PROVIDER_SITE_OTHER): Payer: BLUE CROSS/BLUE SHIELD | Admitting: Ophthalmology

## 2014-11-21 DIAGNOSIS — H338 Other retinal detachments: Secondary | ICD-10-CM

## 2014-11-21 DIAGNOSIS — H2513 Age-related nuclear cataract, bilateral: Secondary | ICD-10-CM

## 2014-11-21 DIAGNOSIS — H43813 Vitreous degeneration, bilateral: Secondary | ICD-10-CM | POA: Diagnosis not present

## 2015-11-21 ENCOUNTER — Ambulatory Visit (INDEPENDENT_AMBULATORY_CARE_PROVIDER_SITE_OTHER): Payer: BLUE CROSS/BLUE SHIELD | Admitting: Ophthalmology
